# Patient Record
Sex: Female | Born: 1945 | Hispanic: Yes | Marital: Married | Smoking: Never smoker
Health system: Southern US, Community
[De-identification: ages and names within clinical notes are randomized; demographics above are authoritative.]

## PROBLEM LIST (undated history)

## (undated) DIAGNOSIS — C50919 Malignant neoplasm of unspecified site of unspecified female breast: Secondary | ICD-10-CM

## (undated) DIAGNOSIS — F32A Depression, unspecified: Secondary | ICD-10-CM

## (undated) DIAGNOSIS — F419 Anxiety disorder, unspecified: Secondary | ICD-10-CM

## (undated) DIAGNOSIS — K219 Gastro-esophageal reflux disease without esophagitis: Secondary | ICD-10-CM

## (undated) HISTORY — PX: ABDOMINAL HYSTERECTOMY: SHX81

## (undated) HISTORY — DX: Anxiety disorder, unspecified: F41.9

## (undated) HISTORY — DX: Malignant neoplasm of unspecified site of unspecified female breast: C50.919

## (undated) HISTORY — DX: Depression, unspecified: F32.A

## (undated) HISTORY — DX: Gastro-esophageal reflux disease without esophagitis: K21.9

## (undated) HISTORY — PX: CHOLECYSTECTOMY: SHX55

---

## 2015-01-11 ENCOUNTER — Other Ambulatory Visit: Payer: Self-pay | Admitting: Family Medicine

## 2015-01-11 DIAGNOSIS — R928 Other abnormal and inconclusive findings on diagnostic imaging of breast: Secondary | ICD-10-CM

## 2015-01-11 DIAGNOSIS — R921 Mammographic calcification found on diagnostic imaging of breast: Secondary | ICD-10-CM

## 2015-01-19 ENCOUNTER — Other Ambulatory Visit: Payer: Self-pay | Admitting: Family Medicine

## 2015-01-19 ENCOUNTER — Ambulatory Visit
Admission: RE | Admit: 2015-01-19 | Discharge: 2015-01-19 | Disposition: A | Discharge status: Home / Self Care | Payer: PRIVATE HEALTH INSURANCE | Source: Ambulatory Visit | Attending: Family Medicine | Admitting: Family Medicine

## 2015-01-19 DIAGNOSIS — R928 Other abnormal and inconclusive findings on diagnostic imaging of breast: Secondary | ICD-10-CM

## 2015-01-19 DIAGNOSIS — R921 Mammographic calcification found on diagnostic imaging of breast: Secondary | ICD-10-CM

## 2015-06-26 ENCOUNTER — Other Ambulatory Visit: Payer: Self-pay | Admitting: Family Medicine

## 2015-06-26 DIAGNOSIS — R921 Mammographic calcification found on diagnostic imaging of breast: Secondary | ICD-10-CM

## 2020-03-06 DIAGNOSIS — R079 Chest pain, unspecified: Secondary | ICD-10-CM

## 2020-08-02 ENCOUNTER — Other Ambulatory Visit: Payer: Self-pay | Admitting: Family Medicine

## 2020-08-02 DIAGNOSIS — N63 Unspecified lump in unspecified breast: Secondary | ICD-10-CM

## 2020-08-08 ENCOUNTER — Other Ambulatory Visit: Payer: PRIVATE HEALTH INSURANCE

## 2020-08-15 ENCOUNTER — Ambulatory Visit
Admission: RE | Admit: 2020-08-15 | Discharge: 2020-08-15 | Disposition: A | Discharge status: Home / Self Care | Payer: Medicare Other | Source: Ambulatory Visit | Attending: Family Medicine | Admitting: Family Medicine

## 2020-08-15 ENCOUNTER — Other Ambulatory Visit: Payer: Self-pay

## 2020-08-15 DIAGNOSIS — N63 Unspecified lump in unspecified breast: Secondary | ICD-10-CM

## 2020-09-27 ENCOUNTER — Telehealth: Payer: Self-pay | Admitting: Oncology

## 2020-09-27 NOTE — Telephone Encounter (Signed)
Patient referred by Dr Orrin Brigham for Breast CA.  Appt made for 10/05/20 Nurse 1:45 PM, Dr Orlene Erm 2:15 pm, Dr Bobby Rumpf 3:15 pm

## 2020-10-04 NOTE — Progress Notes (Signed)
April Galloway  [address] [place],  [place]  [address] [phone]  Clinic Day:  10/05/2020  Referring physician: Dr Orrin Brigham   HISTORY OF PRESENT ILLNESS:  The patient is a 75 y.o. female who I was asked to consult upon for newly diagnosed breast cancer.  The patient recently underwent a screening mammogram in March 2022, which revealed a suspicious mass in her left retroareolar region.  This lesion was confirmed by a diagnostic mammogram and ultrasound in April 2022, with the lesion measuring 1.1 cm.  A biopsy of this lesion revealed invasive cancer.  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  It was estrogen and progesterone receptor positive, but Her2 receptor negative.  One sentinel lymph node was removed, and was negative for cancer.  She comes in today to go over her breast cancer pathology and its implications.  The patient denies ever noticing any left breast changes which alerted her to cancer being present.  Of note, her last mammogram was in 2016.  To her knowledge, there is no family history of breast or ovarian cancer.  PAST MEDICAL HISTORY:   Past Medical History:  Diagnosis Date   Anxiety    Breast cancer ([place])    Depression    GERD (gastroesophageal reflux disease)     PAST SURGICAL HISTORY:  Left breast lumpectomy  CURRENT MEDICATIONS:   Current Outpatient Medications  Medication Sig Dispense Refill   clonazePAM (KLONOPIN) 0.5 MG tablet Take 0.5 mg by mouth 2 (two) times daily as needed.     omeprazole (PRILOSEC) 40 MG capsule Take 40 mg by mouth daily.     propranolol (INDERAL) 10 MG tablet Take 10 mg by mouth at bedtime.     No current facility-administered medications for this visit.    ALLERGIES:   Allergies  Allergen Reactions   Aspirin    Ibuprofen    Oxycodone Nausea And Vomiting and Other (See Comments)    Unsure     FAMILY HISTORY:   Family History   Problem Relation Age of Onset   Heart disease Sister    Hypertension Brother    Anxiety disorder Brother    Depression Brother   Her mother died from uterine cancer.  Her father died from lung cancer.  SOCIAL HISTORY:  The patient was born and raised in [place].  She lives in town with her 2nd husband of 27 years.  She has 3 children and 1 grandchild.  She was a sewer for 25 years.  There is no history of alcohol or tobacco use.    REVIEW OF SYSTEMS:  Review of Systems  Constitutional:  Negative for fatigue and fever.  HENT:   Negative for hearing loss and sore throat.   Eyes:  Negative for eye problems.  Respiratory:  Positive for cough. Negative for chest tightness and hemoptysis.   Cardiovascular:  Negative for chest pain and palpitations.  Gastrointestinal:  Negative for abdominal distention, abdominal pain, blood in stool, constipation, diarrhea, nausea and vomiting.  Endocrine: Negative for hot flashes.  Genitourinary:  Negative for difficulty urinating, dysuria, frequency, hematuria and nocturia.   Musculoskeletal:  Negative for arthralgias, back pain, gait problem and myalgias.  Skin: Negative.  Negative for itching and rash.  Neurological:  Positive for headaches. Negative for dizziness, extremity weakness, gait problem, light-headedness and numbness.  Hematological: Negative.   Psychiatric/Behavioral:  Negative for confusion, depression and suicidal ideas. The patient is nervous/anxious.  PHYSICAL EXAM:  Blood pressure (!) 154/83, pulse 89, temperature 98.3 F (36.8 C), resp. rate 14, height _0  (1.499 m), weight 123 lb 11.2 oz (56.1 kg), SpO2 98 %. Wt Readings from Last 3 Encounters:  10/05/20 123 lb 11.2 oz (56.1 kg)   Body mass index is 24.98 kg/m. Performance status (ECOG): 0 - Asymptomatic Physical Exam Constitutional:      Appearance: Normal appearance.  HENT:     Mouth/Throat:     Pharynx: Oropharynx is clear. No oropharyngeal exudate.  Cardiovascular:      Rate and Rhythm: Normal rate and regular rhythm.     Heart sounds: No murmur heard.   No friction rub. No gallop.  Pulmonary:     Breath sounds: Normal breath sounds.  Chest:  Breasts:    Right: No swelling, bleeding, inverted nipple, mass, nipple discharge, skin change, axillary adenopathy or supraclavicular adenopathy.     Left: No swelling, bleeding, inverted nipple, mass, nipple discharge, skin change, axillary adenopathy or supraclavicular adenopathy.     Comments: Her left breast is healing well from her recent lumpectomy  Abdominal:     General: Bowel sounds are normal. There is no distension.     Palpations: Abdomen is soft. There is no mass.     Tenderness: There is no abdominal tenderness.  Musculoskeletal:        General: No tenderness.     Cervical back: Normal range of motion and neck supple.     Right lower leg: No edema.     Left lower leg: No edema.  Lymphadenopathy:     Cervical: No cervical adenopathy.     Right cervical: No superficial, deep or posterior cervical adenopathy.    Left cervical: No superficial, deep or posterior cervical adenopathy.     Upper Body:     Right upper body: No supraclavicular or axillary adenopathy.     Left upper body: No supraclavicular or axillary adenopathy.     Lower Body: No right inguinal adenopathy. No left inguinal adenopathy.  Skin:    Coloration: Skin is not jaundiced.     Findings: No lesion or rash.  Neurological:     General: No focal deficit present.     Mental Status: She is alert and oriented to person, place, and time. Mental status is at baseline.  Psychiatric:        Mood and Affect: Mood normal.        Behavior: Behavior normal.        Thought Content: Thought content normal.        Judgment: Judgment normal.    ASSESSMENT & PLAN:  A 75 y.o. 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  In clinic today, I went over all of her breast cancer pathology with  her.  As her cancer is hormone sensitive, she know she will need endocrine therapy for her adjuvant disease management.  The only question I have is whether she may also need adjuvant chemotherapy.  I will send her breast cancer tissue for Oncotype testing.  If this test shows her to have a high risk of distant breast cancer recurrence within the next 10 years, adjuvant chemotherapy would be warranted.  Otherwise, endocrine therapy would be all she needs.  I will see her back in 3 weeks to go over her Oncotype test results and their implications.  The patient understands all the plans discussed today and is in agreement  with them.  I do appreciate No ref. provider found for his new consult.   Crews Mccollam Macarthur Critchley, MD

## 2020-10-05 ENCOUNTER — Other Ambulatory Visit: Payer: Self-pay

## 2020-10-05 ENCOUNTER — Inpatient Hospital Stay: Payer: Medicare Other | Attending: Oncology | Admitting: Oncology

## 2020-10-05 ENCOUNTER — Encounter: Payer: Self-pay | Admitting: Oncology

## 2020-10-05 ENCOUNTER — Telehealth: Payer: Self-pay | Admitting: Oncology

## 2020-10-05 DIAGNOSIS — R059 Cough, unspecified: Secondary | ICD-10-CM | POA: Diagnosis not present

## 2020-10-05 DIAGNOSIS — Z170 Estrogen receptor positive status [ER+]: Secondary | ICD-10-CM | POA: Diagnosis not present

## 2020-10-05 DIAGNOSIS — C50919 Malignant neoplasm of unspecified site of unspecified female breast: Secondary | ICD-10-CM | POA: Insufficient documentation

## 2020-10-05 DIAGNOSIS — R519 Headache, unspecified: Secondary | ICD-10-CM

## 2020-10-05 DIAGNOSIS — Z8249 Family history of ischemic heart disease and other diseases of the circulatory system: Secondary | ICD-10-CM

## 2020-10-05 DIAGNOSIS — C50412 Malignant neoplasm of upper-outer quadrant of left female breast: Secondary | ICD-10-CM

## 2020-10-05 DIAGNOSIS — Z818 Family history of other mental and behavioral disorders: Secondary | ICD-10-CM

## 2020-10-05 NOTE — Telephone Encounter (Signed)
Per 6/30 LOS, patient scheduled for 7/21 Follow Up.  Gave patient Appt Summary

## 2020-10-20 NOTE — Progress Notes (Signed)
April Galloway  [address] [place],  [place]  [address] [phone]  Clinic Day:  10/26/2020  Referring physician: Dr Orrin Brigham  This document serves as a record of services personally performed by [name], MD. It was created on their behalf by [facility] E, a trained medical scribe. The creation of this record is based on the scribe's personal observations and the provider's statements to them.  HISTORY OF PRESENT ILLNESS:  The patient is a 75 y.o. 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 She comes in today to review her Oncotype results to determine if she needs adjuvant chemotherapy.  Since her last visit, the patient developed unprovoked bilateral pulmonary emboli.  She denies having any chest wall trauma, leg swelling or other bodily insults which could have potentially triggered her pulmonary emboli.  There is no personal or family history of blood clots.  She is currently taking Eliquis for her anticoagulation.    PHYSICAL EXAM:  Blood pressure 137/70, pulse 68, temperature 98.1 F (36.7 C), resp. rate 16, height 4\' 11"  (1.499 m), weight 124 lb 4.8 oz (56.4 kg), SpO2 97 %.  Her O2 sat's did not drop while ambulating throughout the hallway. Wt Readings from Last 3 Encounters:  10/26/20 124 lb 4.8 oz (56.4 kg)  10/05/20 123 lb 11.2 oz (56.1 kg)   Body mass index is 25.11 kg/m. Performance status (ECOG): 0 - Asymptomatic Physical Exam Constitutional:      Appearance: Normal appearance.  HENT:     Mouth/Throat:     Pharynx: Oropharynx is clear. No oropharyngeal exudate.  Cardiovascular:     Rate and Rhythm: Normal rate and regular rhythm.     Heart sounds: No murmur heard.   No friction rub. No gallop.  Pulmonary:     Breath sounds: Normal breath sounds.  Chest:  Breasts:    Right: No swelling, bleeding, inverted nipple, mass, nipple discharge, skin change, axillary adenopathy  or supraclavicular adenopathy.     Left: No swelling, bleeding, inverted nipple, mass, nipple discharge, skin change, axillary adenopathy or supraclavicular adenopathy.     Comments: Her left breast is healing well from her recent lumpectomy  Abdominal:     General: Bowel sounds are normal. There is no distension.     Palpations: Abdomen is soft. There is no mass.     Tenderness: There is no abdominal tenderness.  Musculoskeletal:        General: No tenderness.     Cervical back: Normal range of motion and neck supple.     Right lower leg: No edema.     Left lower leg: No edema.  Lymphadenopathy:     Cervical: No cervical adenopathy.     Right cervical: No superficial, deep or posterior cervical adenopathy.    Left cervical: No superficial, deep or posterior cervical adenopathy.     Upper Body:     Right upper body: No supraclavicular or axillary adenopathy.     Left upper body: No supraclavicular or axillary adenopathy.     Lower Body: No right inguinal adenopathy. No left inguinal adenopathy.  Skin:    Coloration: Skin is not jaundiced.     Findings: No lesion or rash.  Neurological:     General: No focal deficit present.     Mental Status: She is alert and oriented to person, place, and time. Mental status is at baseline.  Psychiatric:  Mood and Affect: Mood normal.        Behavior: Behavior normal.        Thought Content: Thought content normal.        Judgment: Judgment normal.    ASSESSMENT & PLAN:  A 75 y.o. 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  Unfortunately, her Oncotype test results are not back yet.  I will have her schedule an appointment with our nurse practitioner next week to go over these results.  Most of this visit was focused on the management of her bilateral pulmonary emboli.  She will need to be on Eliquis for at least 6 months.  She may ultimately need to be on this agent indefinitely as her blood clot developed in a seemingly  unprovoked manner.  Otherwise, I will see her back in 4 months for repeat clinical assessment.  The patient understands all the plans discussed today and is in agreement with them.   I, Rita Ohara, am acting as scribe for [name], MD    I have reviewed this report as typed by the medical scribe, and it is complete and accurate.  Ulisses Vondrak Macarthur Critchley, MD

## 2020-10-26 ENCOUNTER — Other Ambulatory Visit: Payer: Self-pay

## 2020-10-26 ENCOUNTER — Inpatient Hospital Stay: Payer: Medicare Other | Attending: Oncology | Admitting: Oncology

## 2020-10-26 VITALS — BP 137/70 | HR 68 | Temp 98.1°F | Resp 16 | Ht 59.0 in | Wt 124.3 lb

## 2020-10-26 DIAGNOSIS — Z170 Estrogen receptor positive status [ER+]: Secondary | ICD-10-CM

## 2020-10-26 DIAGNOSIS — C50312 Malignant neoplasm of lower-inner quadrant of left female breast: Secondary | ICD-10-CM | POA: Diagnosis not present

## 2020-10-26 DIAGNOSIS — I2690 Septic pulmonary embolism without acute cor pulmonale: Secondary | ICD-10-CM

## 2020-10-26 DIAGNOSIS — I2699 Other pulmonary embolism without acute cor pulmonale: Secondary | ICD-10-CM | POA: Insufficient documentation

## 2020-11-03 ENCOUNTER — Inpatient Hospital Stay: Payer: Medicare Other | Admitting: Hematology and Oncology

## 2020-11-03 NOTE — Progress Notes (Deleted)
[facility]  [address] [place],  [place]  [address] [phone]  Clinic Day:  10/26/2020  Referring physician: Dr Orrin Brigham  This document serves as a record of services personally performed by Dequincy Macarthur Critchley, MD. It was created on their behalf by [facility] E, a trained medical scribe. The creation of this record is based on the scribe's personal observations and the provider's statements to them.  HISTORY OF PRESENT ILLNESS:  The patient is a 75 y.o. 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 She comes in today to review her Oncotype results to determine if she needs adjuvant chemotherapy.  Since her last visit, the patient developed unprovoked bilateral pulmonary emboli.  She denies having any chest wall trauma, leg swelling or other bodily insults which could have potentially triggered her pulmonary emboli.  There is no personal or family history of blood clots.  She is currently taking Eliquis for her anticoagulation.    PHYSICAL EXAM:  There were no vitals taken for this visit.  Her O2 sat's did not drop while ambulating throughout the hallway. Wt Readings from Last 3 Encounters:  10/26/20 124 lb 4.8 oz (56.4 kg)  10/05/20 123 lb 11.2 oz (56.1 kg)   There is no height or weight on file to calculate BMI. Performance status (ECOG): 0 - Asymptomatic Physical Exam Constitutional:      Appearance: Normal appearance.  HENT:     Mouth/Throat:     Pharynx: Oropharynx is clear. No oropharyngeal exudate.  Cardiovascular:     Rate and Rhythm: Normal rate and regular rhythm.     Heart sounds: No murmur heard.   No friction rub. No gallop.  Pulmonary:     Breath sounds: Normal breath sounds.  Chest:  Breasts:    Right: No swelling, bleeding, inverted nipple, mass, nipple discharge, skin change, axillary adenopathy or supraclavicular adenopathy.     Left: No swelling, bleeding, inverted nipple,  mass, nipple discharge, skin change, axillary adenopathy or supraclavicular adenopathy.     Comments: Her left breast is healing well from her recent lumpectomy  Abdominal:     General: Bowel sounds are normal. There is no distension.     Palpations: Abdomen is soft. There is no mass.     Tenderness: There is no abdominal tenderness.  Musculoskeletal:        General: No tenderness.     Cervical back: Normal range of motion and neck supple.     Right lower leg: No edema.     Left lower leg: No edema.  Lymphadenopathy:     Cervical: No cervical adenopathy.     Right cervical: No superficial, deep or posterior cervical adenopathy.    Left cervical: No superficial, deep or posterior cervical adenopathy.     Upper Body:     Right upper body: No supraclavicular or axillary adenopathy.     Left upper body: No supraclavicular or axillary adenopathy.     Lower Body: No right inguinal adenopathy. No left inguinal adenopathy.  Skin:    Coloration: Skin is not jaundiced.     Findings: No lesion or rash.  Neurological:     General: No focal deficit present.     Mental Status: She is alert and oriented to person, place, and time. Mental status is at baseline.  Psychiatric:        Mood and Affect: Mood normal.  Behavior: Behavior normal.        Thought Content: Thought content normal.        Judgment: Judgment normal.    ASSESSMENT & PLAN:  A 75 y.o. 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  Unfortunately, her Oncotype test results are not back yet.  I will have her schedule an appointment with our nurse practitioner next week to go over these results.  Most of this visit was focused on the management of her bilateral pulmonary emboli.  She will need to be on Eliquis for at least 6 months.  She may ultimately need to be on this agent indefinitely as her blood clot developed in a seemingly unprovoked manner.  Otherwise, I will see her back in 4 months for repeat  clinical assessment.  The patient understands all the plans discussed today and is in agreement with them.   I, Rita Ohara, am acting as scribe for [name], MD    I have reviewed this report as typed by the medical scribe, and it is complete and accurate.  Dequincy Macarthur Critchley, MD

## 2020-11-06 NOTE — Progress Notes (Signed)
[facility]  [address] [place],  [place]  [address] [phone]  Clinic Day:  11/20/2020  Referring physician: Dr Orrin Brigham  This document serves as a record of services personally performed by Vestal Markin Macarthur Critchley, MD. It was created on their behalf by [facility] E, a trained medical scribe. The creation of this record is based on the scribe's personal observations and the provider's statements to them.  HISTORY OF PRESENT ILLNESS:  The patient is a 75 y.o. 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  She comes in today to review her Oncotype results to determine if she needs adjuvant chemotherapy.  The patient claims she has not noticed any changes in her breast which concern her for early disease recurrence.   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  She is currently taking Eliquis for her anticoagulation.    PHYSICAL EXAM:  Blood pressure 127/66, pulse 74, temperature 98.4 F (36.9 C), temperature source Oral, resp. rate 16, height '4\' 11"'$  (1.499 m), weight 126 lb 12.8 oz (57.5 kg), SpO2 97 %.   Wt Readings from Last 3 Encounters:  11/20/20 126 lb 12.8 oz (57.5 kg)  10/26/20 124 lb 4.8 oz (56.4 kg)  10/05/20 123 lb 11.2 oz (56.1 kg)   Body mass index is 25.61 kg/m. Performance status (ECOG): 0 - Asymptomatic Physical Exam Constitutional:      General: She is not in acute distress.    Appearance: Normal appearance. She is normal weight.  HENT:     Head: Normocephalic and atraumatic.  Eyes:     General: No scleral icterus.    Extraocular Movements: Extraocular movements intact.     Conjunctiva/sclera: Conjunctivae normal.     Pupils: Pupils are equal, round, and reactive to light.  Cardiovascular:     Rate and Rhythm: Normal rate and regular rhythm.     Pulses: Normal pulses.     Heart sounds: Normal heart sounds. No  murmur heard.   No friction rub. No gallop.  Pulmonary:     Effort: Pulmonary effort is normal. No respiratory distress.     Breath sounds: Normal breath sounds.  Abdominal:     General: Bowel sounds are normal. There is no distension.     Palpations: Abdomen is soft. There is no hepatomegaly, splenomegaly or mass.     Tenderness: There is no abdominal tenderness.  Musculoskeletal:        General: Normal range of motion.     Cervical back: Normal range of motion and neck supple.     Right lower leg: No edema.     Left lower leg: No edema.  Lymphadenopathy:     Cervical: No cervical adenopathy.  Skin:    General: Skin is warm and dry.  Neurological:     General: No focal deficit present.     Mental Status: She is alert and oriented to person, place, and time. Mental status is at baseline.  Psychiatric:        Mood and Affect: Mood normal.        Behavior: Behavior normal.        Thought Content: Thought content normal.        Judgment: Judgment normal.   TEST RESULTS:   Her Oncotype results showed her with a recurrence score of 26.  This places her at a 16% chance of disease recurrence  within the next 9 years if endocrine therapy was to be given by itself.  This places her in the [place] category where adjuvant chemotherapy is recommended.  ASSESSMENT & PLAN:  A 75 y.o. 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  In clinic today, I went over her Oncotype test results with her, for which she understands her tumor biology suggests adjuvant chemotherapy is necessary to help further decrease her risk of distant breast cancer recurrence.  Upon hearing this, the patient immediately began getting nervous about taking adjuvant chemotherapy; she has heard about all the side effects chemotherapy can cause.  She now appears uninterested in undergoing adjuvant chemotherapy.  I reminded the patient that I would have never ordered Oncotype testing at all if she had shared these  same concerns about chemotherapy, which she did not express before her Oncotype testing was done.  For now, I will place her on letrozole 2.5 mg daily x 5 days for her adjuvant endocrine therapy.  Nevertheless, she understands that taking this route, without including adjuvant chemotherapy, appears not to be the best possible option in preventing disease recurrence, especially when factoring in her Oncotype results.  Moving forward, her disease surveillance will consist of clinical breast exams every 4 months, as well as annual mammograms.  I will see her back in December 2022 for a repeat clinical breast exam. The patient understands all the plans discussed today and is in agreement with them.   I, Rita Ohara, am acting as scribe for [name], MD    I have reviewed this report as typed by the medical scribe, and it is complete and accurate.  Dean Goldner Macarthur Critchley, MD

## 2020-11-20 ENCOUNTER — Inpatient Hospital Stay: Payer: Medicare Other | Attending: Oncology | Admitting: Oncology

## 2020-11-20 ENCOUNTER — Other Ambulatory Visit: Payer: Self-pay

## 2020-11-20 ENCOUNTER — Telehealth: Payer: Self-pay | Admitting: Oncology

## 2020-11-20 VITALS — BP 127/66 | HR 74 | Temp 98.4°F | Resp 16 | Ht 59.0 in | Wt 126.8 lb

## 2020-11-20 DIAGNOSIS — C50219 Malignant neoplasm of upper-inner quadrant of unspecified female breast: Secondary | ICD-10-CM | POA: Diagnosis not present

## 2020-11-20 DIAGNOSIS — Z170 Estrogen receptor positive status [ER+]: Secondary | ICD-10-CM | POA: Diagnosis not present

## 2020-11-20 MED ORDER — LETROZOLE 2.5 MG PO TABS: 2.5000 mg | ORAL_TABLET | Freq: Every day | ORAL | 3 refills | Status: AC

## 2020-11-20 NOTE — Telephone Encounter (Signed)
Per 8/15 LOS next appt scheduled and given to patient

## 2020-11-24 ENCOUNTER — Encounter: Payer: Self-pay | Admitting: Oncology

## 2020-12-22 ENCOUNTER — Inpatient Hospital Stay: Payer: Medicare Other | Attending: Oncology | Admitting: Hematology and Oncology

## 2020-12-22 ENCOUNTER — Encounter: Payer: Self-pay | Admitting: Hematology and Oncology

## 2020-12-22 DIAGNOSIS — C50219 Malignant neoplasm of upper-inner quadrant of unspecified female breast: Secondary | ICD-10-CM

## 2020-12-22 DIAGNOSIS — Z170 Estrogen receptor positive status [ER+]: Secondary | ICD-10-CM

## 2020-12-22 NOTE — Progress Notes (Signed)
[facility]  [address] [place],  [place]  [address] [phone]  Clinic Day:  12/22/2020  Referring physician: No ref. provider found  ASSESSMENT & PLAN:   Assessment & Plan: Breast cancer ([place]) She has had multiple side effects with letrozole including dizziness and hot flashes. Over the last few days she has developed stomach pain. She is wanting to discuss the need for this medication as she is confused about its action. We discussed that the medicine is a hormone blocker to block estrogen which was found in her breast cancer. Studies have shown continuation of this class of medicines for patients with hormone positive breast over a period of 5 years improves their outcome. We discussed the side effects and how those can be managed. She has agreed to continue with medication. I will send in zofran for nausea. She has been started on prilosec per her PCP and hopefully this will help as well. She will keep her scheduled appointment with Dr. Bobby Rumpf.   The patient understands the plans discussed today and is in agreement with them.  She knows to contact our office if she develops concerns prior to her next appointment.  Today's visit was conducted with the assistance of an interpreter.   Melodye Ped, NP  [facility] [address] [place] [address] Dept: [phone] Dept Fax: [phone]   No orders of the defined types were placed in this encounter.     CHIEF COMPLAINT:  CC: A 75 year old female with history of breast cancer here for symptom management, new onset stomach pain  Current Treatment:  Letrozole daily   HISTORY OF PRESENT ILLNESS:   Oncology History  Breast cancer ([place])  10/05/2020 Initial Diagnosis   Breast cancer ([place])   10/05/2020 Cancer Staging   Staging form: Breast, AJCC 8th Edition - Pathologic stage from 10/05/2020: Stage IA (pT2, pN0, cM0, G2,  ER+, PR+, HER2-) - Signed by [name], MD on 10/05/2020 Histopathologic type: Infiltrating duct carcinoma, NOS Stage prefix: Initial diagnosis Multigene prognostic tests performed: Oncotype DX Histologic grading system: 3 grade system      INTERVAL HISTORY:  Maily is here today for symptom management. She has been on letrozole for one month and reports side effects dizziness, hot flashes and nausea. Recently she started experiencing stomach pain. We discussed at length the role of letrozole in her disease. We reviewed the side effects and how we can help manage them. She denies fever or chills. She denies shortness of breath, chest pain or cough. She denies issue with bowel or bladder.   REVIEW OF SYSTEMS:  Review of Systems  Constitutional:  Negative for appetite change, chills, diaphoresis, fatigue, fever and unexpected weight change.  HENT:   Negative for hearing loss, lump/mass, mouth sores, nosebleeds, sore throat, tinnitus, trouble swallowing and voice change.   Eyes:  Negative for eye problems and icterus.  Respiratory:  Negative for chest tightness, cough, hemoptysis, shortness of breath and wheezing.   Cardiovascular:  Negative for chest pain, leg swelling and palpitations.  Gastrointestinal:  Positive for abdominal pain and nausea. Negative for abdominal distention, blood in stool, constipation, diarrhea, rectal pain and vomiting.  Endocrine: Positive for hot flashes.  Genitourinary:  Negative for bladder incontinence, difficulty urinating, dyspareunia, dysuria, frequency, hematuria, menstrual problem, nocturia, pelvic pain, vaginal bleeding and vaginal discharge.   Musculoskeletal:  Negative for arthralgias, back pain, flank pain, gait problem, myalgias, neck pain  and neck stiffness.  Skin:  Negative for itching, rash and wound.  Neurological:  Positive for dizziness. Negative for extremity weakness, gait problem, headaches, light-headedness, numbness, seizures and speech  difficulty.  Hematological:  Negative for adenopathy. Does not bruise/bleed easily.  Psychiatric/Behavioral:  Negative for confusion, decreased concentration, depression, sleep disturbance and suicidal ideas. The patient is not nervous/anxious.     VITALS:  Blood pressure 134/64, pulse 74, temperature 98.4 F (36.9 C), temperature source Oral, resp. rate 20, height _0  (1.499 m), weight 125 lb (56.7 kg), SpO2 98 %.  Wt Readings from Last 3 Encounters:  12/22/20 125 lb (56.7 kg)  11/20/20 126 lb 12.8 oz (57.5 kg)  10/26/20 124 lb 4.8 oz (56.4 kg)    Body mass index is 25.25 kg/m.  Performance status (ECOG): 1 - Symptomatic but completely ambulatory  PHYSICAL EXAM:  Physical Exam Constitutional:      General: She is not in acute distress.    Appearance: Normal appearance. She is normal weight. She is not ill-appearing, toxic-appearing or diaphoretic.  HENT:     Head: Normocephalic and atraumatic.     Nose: Nose normal. No congestion or rhinorrhea.     Mouth/Throat:     Mouth: Mucous membranes are moist.     Pharynx: Oropharynx is clear. No oropharyngeal exudate or posterior oropharyngeal erythema.  Eyes:     General: No scleral icterus.       Right eye: No discharge.        Left eye: No discharge.     Extraocular Movements: Extraocular movements intact.     Conjunctiva/sclera: Conjunctivae normal.     Pupils: Pupils are equal, round, and reactive to light.  Neck:     Vascular: No carotid bruit.  Cardiovascular:     Rate and Rhythm: Normal rate and regular rhythm.     Heart sounds: No murmur heard.   No friction rub. No gallop.  Pulmonary:     Effort: Pulmonary effort is normal. No respiratory distress.     Breath sounds: Normal breath sounds. No stridor. No wheezing, rhonchi or rales.  Chest:     Chest wall: No tenderness.  Abdominal:     General: Abdomen is flat. Bowel sounds are normal. There is no distension.     Palpations: There is no mass.     Tenderness: There  is no abdominal tenderness. There is no right CVA tenderness, left CVA tenderness, guarding or rebound.     Hernia: No hernia is present.  Musculoskeletal:        General: No swelling, tenderness, deformity or signs of injury. Normal range of motion.     Cervical back: Normal range of motion and neck supple. No rigidity or tenderness.     Right lower leg: No edema.     Left lower leg: No edema.  Lymphadenopathy:     Cervical: No cervical adenopathy.  Skin:    General: Skin is warm and dry.     Capillary Refill: Capillary refill takes less than 2 seconds.     Coloration: Skin is not jaundiced or pale.     Findings: No bruising, erythema, lesion or rash.  Neurological:     General: No focal deficit present.     Mental Status: She is alert and oriented to person, place, and time. Mental status is at baseline.     Cranial Nerves: No cranial nerve deficit.     Sensory: No sensory deficit.     Motor: No weakness.  Coordination: Coordination normal.     Gait: Gait normal.     Deep Tendon Reflexes: Reflexes normal.  Psychiatric:        Mood and Affect: Mood normal.        Behavior: Behavior normal.        Thought Content: Thought content normal.        Judgment: Judgment normal.    LABS:  No flowsheet data found. No flowsheet data found.   No results found for: CEA1 / No results found for: CEA1 No results found for: PSA1 No results found for: GBE010 No results found for: CAN125  No results found for: TOTALPROTELP, ALBUMINELP, A1GS, A2GS, BETS, BETA2SER, GAMS, MSPIKE, SPEI No results found for: TIBC, FERRITIN, IRONPCTSAT No results found for: LDH  STUDIES:  No results found.    HISTORY:   Past Medical History:  Diagnosis Date   Anxiety    Breast cancer ([place])    Depression    GERD (gastroesophageal reflux disease)       Family History  Problem Relation Age of Onset   Heart disease Sister    Hypertension Brother    Anxiety disorder Brother    Depression Brother      Social History:  reports that she has never smoked. She has never used smokeless tobacco. She reports that she does not currently use alcohol. She reports that she does not currently use drugs.The patient is accompanied by interpreter today.  Allergies:  Allergies  Allergen Reactions   Aspirin    Ibuprofen    Oxycodone Nausea And Vomiting and Other (See Comments)    Unsure     Current Medications: Current Outpatient Medications  Medication Sig Dispense Refill   clonazePAM (KLONOPIN) 0.5 MG tablet Take 1 tablet by mouth 2 (two) times daily as needed.     acetic acid-hydrocortisone (VOSOL-HC) OTIC solution SMARTSIG:5 Drop(s) In Ear(s) Every 6 Hours     alendronate (FOSAMAX) 70 MG tablet Take 70 mg by mouth once a week.     alendronate (FOSAMAX) 70 MG/75ML solution Take by mouth.     busPIRone (BUSPAR) 10 MG tablet SMARTSIG:1 Tablet(s) By Mouth Every 12 Hours PRN     clonazePAM (KLONOPIN) 0.5 MG tablet Take 0.5 mg by mouth 2 (two) times daily as needed.     dicyclomine (BENTYL) 10 MG capsule Take 10 mg by mouth 2 (two) times daily as needed.     docusate sodium (COLACE) 100 MG capsule Take by mouth 2 (two) times daily.     HYDROcodone-acetaminophen (NORCO/VICODIN) 5-325 MG tablet Take 1 tablet by mouth every 4 (four) hours as needed.     hydrocortisone (ANUSOL-HC) 2.5 % rectal cream PLEASE SEE ATTACHED FOR DETAILED DIRECTIONS     latanoprost (XALATAN) 0.005 % ophthalmic solution 1 drop at bedtime.     letrozole (FEMARA) 2.5 MG tablet Take 1 tablet (2.5 mg total) by mouth daily. 90 tablet 3   omeprazole (PRILOSEC) 40 MG capsule Take 40 mg by mouth daily.     propranolol (INDERAL) 10 MG tablet Take 10 mg by mouth at bedtime.     No current facility-administered medications for this visit.

## 2020-12-22 NOTE — Assessment & Plan Note (Signed)
She has had multiple side effects with letrozole including dizziness and hot flashes. Over the last few days she has developed stomach pain. She is wanting to discuss the need for this medication as she is confused about its action. We discussed that the medicine is a hormone blocker to block estrogen which was found in her breast cancer. Studies have shown continuation of this class of medicines for patients with hormone positive breast over a period of 5 years improves their outcome. We discussed the side effects and how those can be managed. She has agreed to continue with medication. I will send in zofran for nausea. She has been started on prilosec per her PCP and hopefully this will help as well. She will keep her scheduled appointment with Dr. Bobby Rumpf.

## 2020-12-28 ENCOUNTER — Telehealth: Payer: Self-pay

## 2020-12-28 ENCOUNTER — Other Ambulatory Visit: Payer: Self-pay | Admitting: Hematology and Oncology

## 2020-12-28 MED ORDER — ONDANSETRON HCL 4 MG PO TABS: 4.0000 mg | ORAL_TABLET | Freq: Three times a day (TID) | ORAL | 2 refills | Status: AC | PRN

## 2020-12-28 NOTE — Telephone Encounter (Addendum)
01/05/21 - I called pt to see how she is doing and if there have been any appt's made. I wanted to make sure she was being taken care of. Pt does have a little abdominal pain. "I went back to [facility] ED.  They have me a MRI scheduled. They told me it might be good to get an oncologist @ [facility], but I'm not sure". I asked pt to please keep [place] up to date and let us know if she chooses another office. I wished her well.      12/28/20 -Pt went to ED @ [facility] over the weekend with abdominal pain. They told her she has new liver lesion. Pt asking if she should take the medication? Is there a diet she should try?  I notified Dr Bobby Rumpf of above @ [id number]-awc

## 2021-02-06 ENCOUNTER — Telehealth: Payer: Self-pay

## 2021-02-06 NOTE — Telephone Encounter (Signed)
Mykia Holton,RN:  I notified Dr Bobby Rumpf, & Melissa P,NP of below.   Damaris F., Registration :  Patient is on the phone stating she had an appt w/ her PCP. She told her PCP that she stopped her Cancer medication but they recommended that she call us to let us know about that. Patient did not taker her medication starting 12/07/20.

## 2021-02-26 ENCOUNTER — Ambulatory Visit: Payer: Medicare Other | Admitting: Oncology

## 2021-03-15 NOTE — Progress Notes (Signed)
April Galloway  [address] [place],  [place]  [address] [phone]  Clinic Day:  03/22/2021  Referring physician: Dr Orrin Brigham  This document serves as a record of services personally performed by April Chalmers Macarthur Critchley, MD. It was created on their behalf by April Galloway, a trained medical scribe. The creation of this record is based on the scribe's personal observations and the provider's statements to them.  HISTORY OF PRESENT ILLNESS:  The patient is a 75 y.o. 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  Oncotype testing results suggested adjuvant chemotherapy was necessary, but the patient declined taking it. She was placed on adjuvant endocrine therapy with letrozole 2.5 mg daily; however, she stopped taking this agent as she was concerned it was behind her weakness.  She comes back in today for routine follow up.  The patient claims she has not noticed any changes in her breast which concern her for early disease recurrence.   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  She was prescribed Eliquis for her anticoagulation, but she stopped taking this agent as well due to having generalized malaise.  The discontinuation of these medications were against my recommendation.    PHYSICAL EXAM:  Blood pressure 133/66, pulse 80, temperature 98.2 F (36.8 C), resp. rate 14, height 4\' 11"  (1.499 m), weight 124 lb 9.6 oz (56.5 kg), SpO2 97 %.   Wt Readings from Last 3 Encounters:  03/22/21 124 lb 9.6 oz (56.5 kg)  12/22/20 125 lb (56.7 kg)  11/20/20 126 lb 12.8 oz (57.5 kg)   Body mass index is 25.17 kg/m. Performance status (ECOG): 0 - Asymptomatic Physical Exam Constitutional:      General: She is not in acute distress.    Appearance: Normal appearance. She is normal weight.  HENT:     Head: Normocephalic and atraumatic.      Mouth/Throat:     Pharynx: Oropharynx is clear. No oropharyngeal exudate.  Eyes:     General: No scleral icterus.    Extraocular Movements: Extraocular movements intact.     Conjunctiva/sclera: Conjunctivae normal.     Pupils: Pupils are equal, round, and reactive to light.  Cardiovascular:     Rate and Rhythm: Normal rate and regular rhythm.     Pulses: Normal pulses.     Heart sounds: Normal heart sounds. No murmur heard.   No friction rub. No gallop.  Pulmonary:     Effort: Pulmonary effort is normal. No respiratory distress.     Breath sounds: Normal breath sounds.  Chest:  Breasts:    Right: No swelling, bleeding, inverted nipple, mass, nipple discharge or skin change.     Left: No swelling, bleeding, inverted nipple, mass, nipple discharge or skin change.  Abdominal:     General: Bowel sounds are normal. There is no distension.     Palpations: Abdomen is soft. There is no hepatomegaly, splenomegaly or mass.     Tenderness: There is no abdominal tenderness.  Musculoskeletal:        General: No tenderness. Normal range of motion.     Cervical back: Normal range of motion and neck supple.     Right lower leg: No edema.     Left lower leg: No edema.  Lymphadenopathy:     Cervical: No cervical adenopathy.     Right cervical: No superficial, deep or posterior cervical  adenopathy.    Left cervical: No superficial, deep or posterior cervical adenopathy.     Upper Body:     Right upper body: No supraclavicular or axillary adenopathy.     Left upper body: No supraclavicular or axillary adenopathy.     Lower Body: No right inguinal adenopathy. No left inguinal adenopathy.  Skin:    General: Skin is warm and dry.     Coloration: Skin is not jaundiced.     Findings: No lesion or rash.  Neurological:     General: No focal deficit present.     Mental Status: She is alert and oriented to person, place, and time. Mental status is at baseline.  Psychiatric:        Mood and Affect:  Mood normal.        Behavior: Behavior normal.        Thought Content: Thought content normal.        Judgment: Judgment normal.   ASSESSMENT & PLAN:  A 75 y.o. 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  As mentioned previously, she stopped taking her letrozole months ago for various reasons.  Of note, the patient brought her husband with her to her visit today, which was the first time he has been with her to the cancer center.  After speaking with her at this visit, he helped her realize that she is really uncertain as to what medicine led to all of her generalized problems.  After deliberating over this, the patient ultimately decided to restart her letrozole, which I wholeheartedly am in agreement.  She understands that I still feel she is being undertreated, particularly as her Oncotype test results recommended adjuvant chemotherapy.  Nevertheless, I will acquiesce to their wishes/plans.  I encouraged the patient to contact our office over these next few weeks if she runs into any significant side effects related to her letrozole.  Otherwise, I will see her back in 4 months for a repeat clinical breast exam.  Her annual mammogram has already been scheduled before her next visit for her continued radiographic breast cancer surveillance. The patient understands all the plans discussed today and is in agreement with them.   I, Rita Ohara, am acting as scribe for [name], MD    I have reviewed this report as typed by the medical scribe, and it is complete and accurate.  April Stauder Macarthur Critchley, MD

## 2021-03-22 ENCOUNTER — Inpatient Hospital Stay: Payer: Medicare Other | Attending: Oncology | Admitting: Oncology

## 2021-03-22 ENCOUNTER — Telehealth: Payer: Self-pay | Admitting: Oncology

## 2021-03-22 VITALS — BP 133/66 | HR 80 | Temp 98.2°F | Resp 14 | Ht 59.0 in | Wt 124.6 lb

## 2021-03-22 DIAGNOSIS — C50519 Malignant neoplasm of lower-outer quadrant of unspecified female breast: Secondary | ICD-10-CM

## 2021-03-22 DIAGNOSIS — Z170 Estrogen receptor positive status [ER+]: Secondary | ICD-10-CM | POA: Diagnosis not present

## 2021-03-22 NOTE — Telephone Encounter (Signed)
Per 12/15 LOS, patient scheduled for April 2023 Appt.  Gave patient Appt Summary

## 2021-07-19 NOTE — Progress Notes (Incomplete)
?[facility]  ?[address] ?[place],  [place]  [address] ?[phone] ? ?Clinic Day:  03/22/2021 ? ?Referring physician: Dr Orrin Brigham ? ?This document serves as a record of services personally performed by [name], MD. It was created on their behalf by Curry,Lauren E, a trained medical scribe. The creation of this record is based on the scribe's personal observations and the provider's statements to them. ? ?HISTORY OF PRESENT ILLNESS:  ?The patient is a 76 y.o. 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  Oncotype testing results suggested adjuvant chemotherapy was necessary, but the patient declined taking it. She was placed on adjuvant endocrine therapy with letrozole 2.5 mg daily; however, she stopped taking this agent as she was concerned it was behind her weakness.  She comes back in today for routine follow up.  The patient claims she has not noticed any changes in her breast which concern her for early disease recurrence.   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  She was prescribed Eliquis for her anticoagulation, but she stopped taking this agent as well due to having generalized malaise.  The discontinuation of these medications were against my recommendation.   ? ?PHYSICAL EXAM:  ?There were no vitals taken for this visit.   ?Wt Readings from Last 3 Encounters:  ?03/22/21 124 lb 9.6 oz (56.5 kg)  ?12/22/20 125 lb (56.7 kg)  ?11/20/20 126 lb 12.8 oz (57.5 kg)  ? ?There is no height or weight on file to calculate BMI. ?Performance status (ECOG): 0 - Asymptomatic ?Physical Exam ?Constitutional:   ?   General: She is not in acute distress. ?   Appearance: Normal appearance. She is normal weight.  ?HENT:  ?   Head: Normocephalic and atraumatic.  ?   Mouth/Throat:  ?   Pharynx: Oropharynx is clear. No oropharyngeal exudate.  ?Eyes:  ?    General: No scleral icterus. ?   Extraocular Movements: Extraocular movements intact.  ?   Conjunctiva/sclera: Conjunctivae normal.  ?   Pupils: Pupils are equal, round, and reactive to light.  ?Cardiovascular:  ?   Rate and Rhythm: Normal rate and regular rhythm.  ?   Pulses: Normal pulses.  ?   Heart sounds: Normal heart sounds. No murmur heard. ?  No friction rub. No gallop.  ?Pulmonary:  ?   Effort: Pulmonary effort is normal. No respiratory distress.  ?   Breath sounds: Normal breath sounds.  ?Chest:  ?Breasts: ?   Right: No swelling, bleeding, inverted nipple, mass, nipple discharge or skin change.  ?   Left: No swelling, bleeding, inverted nipple, mass, nipple discharge or skin change.  ?Abdominal:  ?   General: Bowel sounds are normal. There is no distension.  ?   Palpations: Abdomen is soft. There is no hepatomegaly, splenomegaly or mass.  ?   Tenderness: There is no abdominal tenderness.  ?Musculoskeletal:     ?   General: No tenderness. Normal range of motion.  ?   Cervical back: Normal range of motion and neck supple.  ?   Right lower leg: No edema.  ?   Left lower leg: No edema.  ?Lymphadenopathy:  ?   Cervical: No cervical adenopathy.  ?   Right cervical: No superficial, deep or posterior cervical adenopathy. ?   Left cervical: No superficial, deep or posterior cervical adenopathy.  ?  Upper Body:  ?   Right upper body: No supraclavicular or axillary adenopathy.  ?   Left upper body: No supraclavicular or axillary adenopathy.  ?   Lower Body: No right inguinal adenopathy. No left inguinal adenopathy.  ?Skin: ?   General: Skin is warm and dry.  ?   Coloration: Skin is not jaundiced.  ?   Findings: No lesion or rash.  ?Neurological:  ?   General: No focal deficit present.  ?   Mental Status: She is alert and oriented to person, place, and time. Mental status is at baseline.  ?Psychiatric:     ?   Mood and Affect: Mood normal.     ?   Behavior: Behavior normal.     ?   Thought Content: Thought content  normal.     ?   Judgment: Judgment normal.  ? ?ASSESSMENT & PLAN:  ?A 76 y.o. 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  As mentioned previously, she stopped taking her letrozole months ago for various reasons.  Of note, the patient brought her husband with her to her visit today, which was the first time he has been with her to the cancer center.  After speaking with her at this visit, he helped her realize that she is really uncertain as to what medicine led to all of her generalized problems.  After deliberating over this, the patient ultimately decided to restart her letrozole, which I wholeheartedly am in agreement.  She understands that I still feel she is being undertreated, particularly as her Oncotype test results recommended adjuvant chemotherapy.  Nevertheless, I will acquiesce to their wishes/plans.  I encouraged the patient to contact our office over these next few weeks if she runs into any significant side effects related to her letrozole.  Otherwise, I will see her back in 4 months for a repeat clinical breast exam.  Her annual mammogram has already been scheduled before her next visit for her continued radiographic breast cancer surveillance. The patient understands all the plans discussed today and is in agreement with them. ? ? ?I, Rita Ohara, am acting as scribe for [name], MD   ? ?I have reviewed this report as typed by the medical scribe, and it is complete and accurate. ? ?Lilly Gasser Macarthur Critchley, MD ? ? ? ?  ? ?

## 2021-07-20 ENCOUNTER — Telehealth: Payer: Self-pay | Admitting: Oncology

## 2021-07-20 ENCOUNTER — Ambulatory Visit: Payer: Medicare Other | Admitting: Oncology

## 2021-07-20 NOTE — Telephone Encounter (Signed)
Interpreter spoke w/patient concerning today's Appt - She stated she called yesterday to cancel the Appt due to seeking 2nd Opinion ?

## 2021-07-20 NOTE — Telephone Encounter (Signed)
Patient rescheduled today's Appt to 4/28 due to forgetting about Appt ?

## 2023-02-01 IMAGING — MG MM BREAST LOCALIZATION CLIP
4 series · 4 of 12 positions shown · non-contrast
Comparison: Previous exam(s).

CLINICAL DATA: 74-year-old female status post ultrasound-guided
biopsy of the left breast.

EXAM:
DIAGNOSTIC LEFT MAMMOGRAM POST ULTRASOUND BIOPSY

[L ML synth-2D]
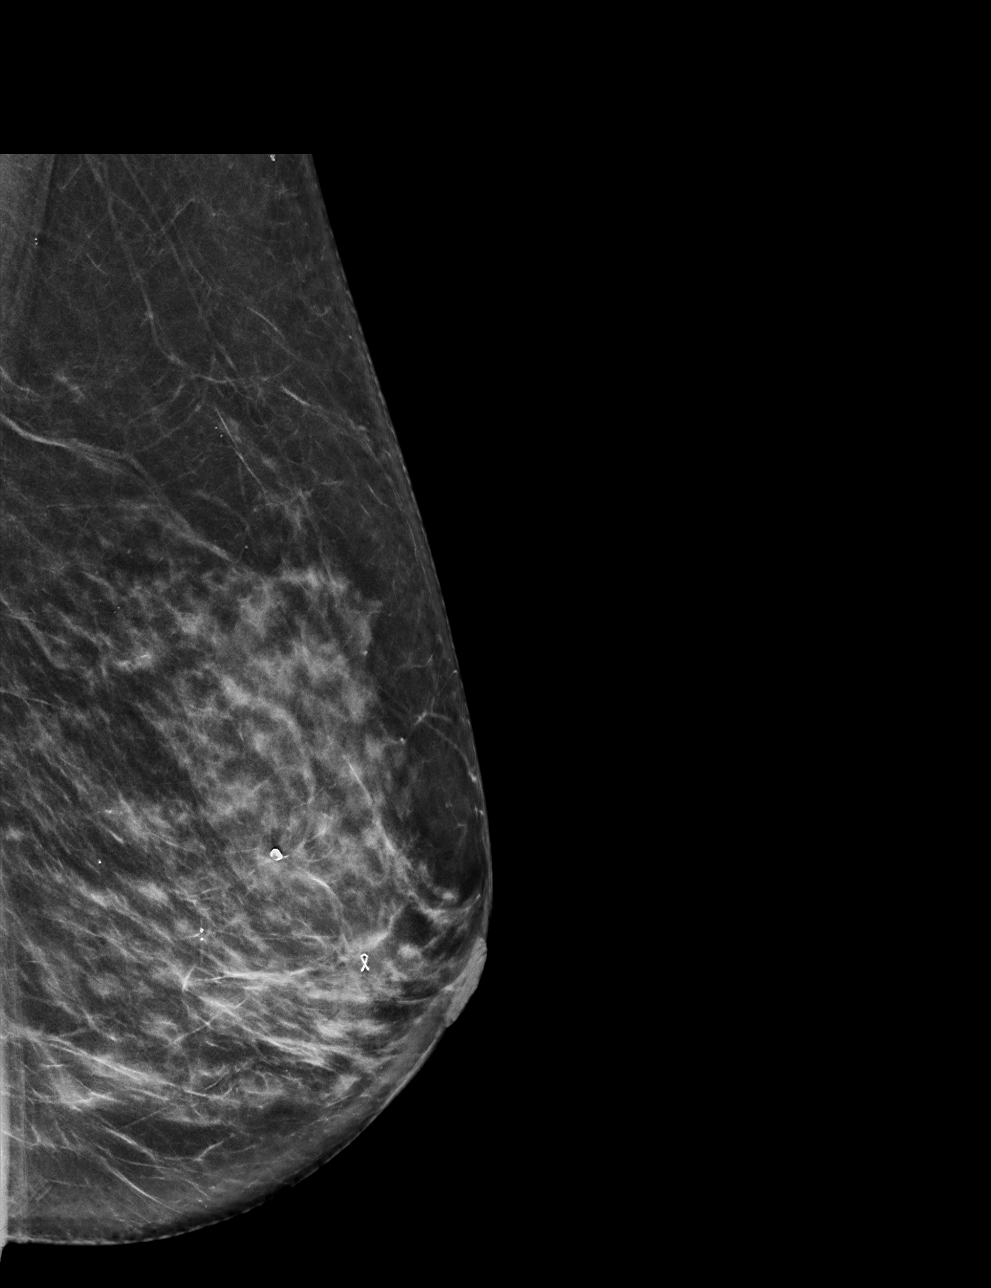

[L CC synth-2D]
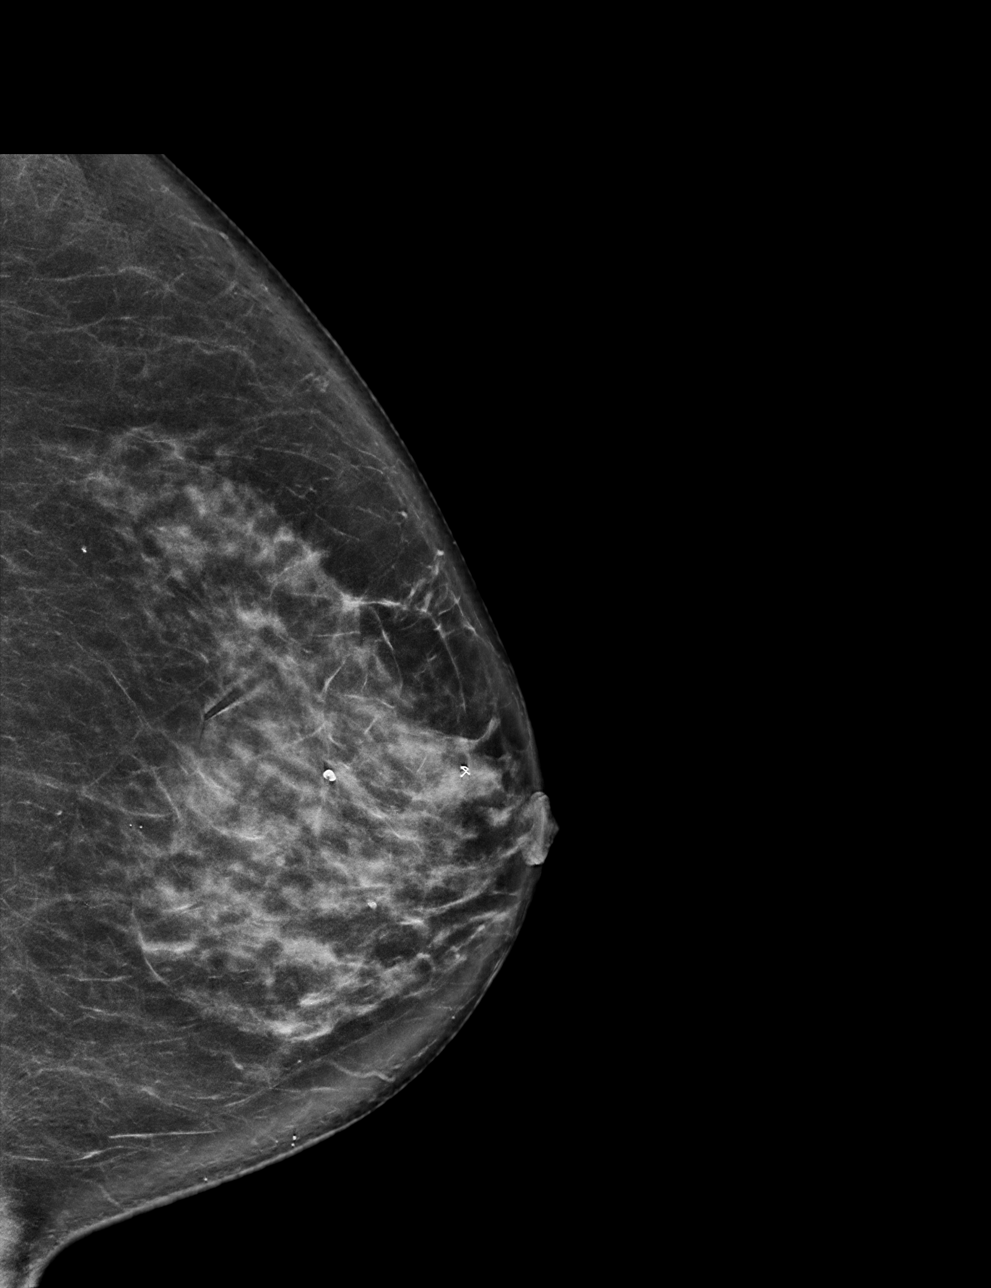

[L ML tomo · tomo slice 35/70.0]
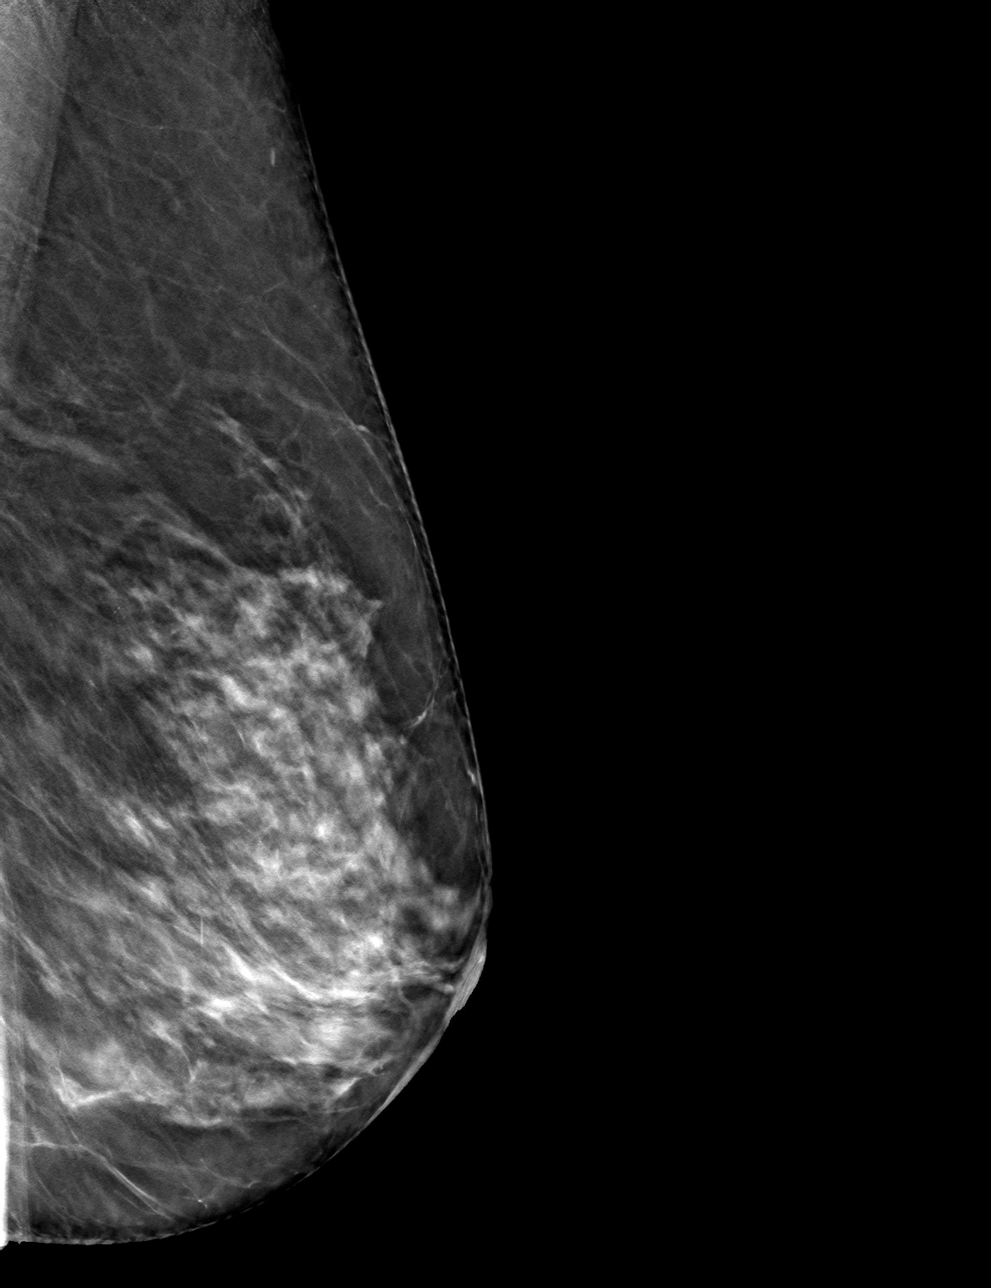

[L CC tomo · tomo slice 37/73.0]
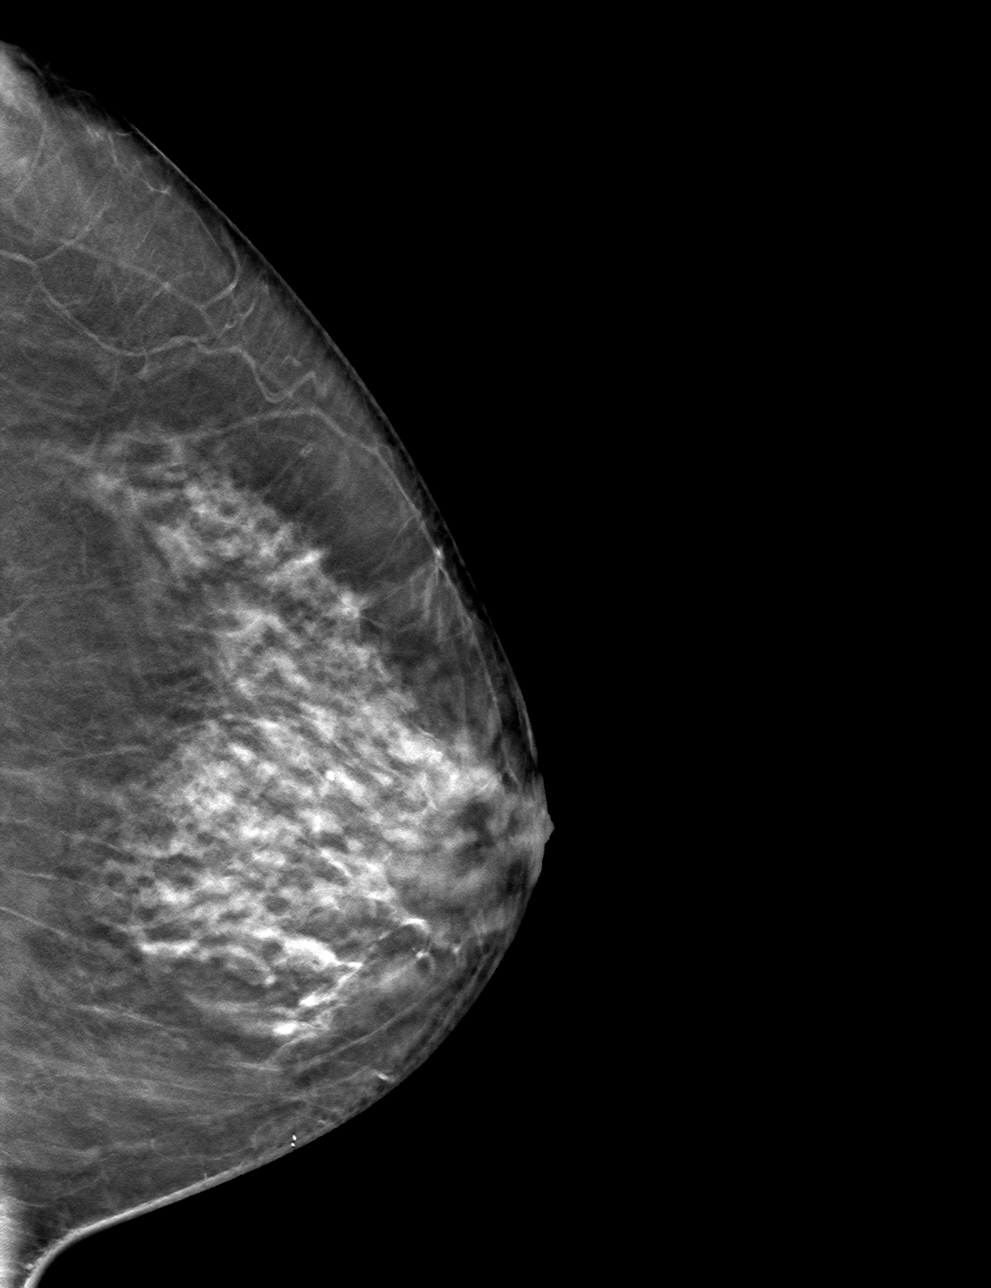

[4 of 12 positions shown; findings below may reference images not displayed]

FINDINGS: Mammographic images were obtained following ultrasound guided biopsy
of the left breast. The biopsy marking clip is in expected position
at the site of biopsy.
IMPRESSION: Appropriate positioning of the ribbon shaped biopsy marking clip at
the site of biopsy in the subareolar left breast.

Final Assessment: Post Procedure Mammograms for Marker Placement
